# Patient Record
Sex: Female | Born: 1990
Health system: Southern US, Community
[De-identification: ages and names within clinical notes are randomized; demographics above are authoritative.]

## PROBLEM LIST (undated history)

## (undated) DIAGNOSIS — G4733 Obstructive sleep apnea (adult) (pediatric): Secondary | ICD-10-CM

## (undated) DIAGNOSIS — F419 Anxiety disorder, unspecified: Secondary | ICD-10-CM

## (undated) DIAGNOSIS — J45909 Unspecified asthma, uncomplicated: Secondary | ICD-10-CM

## (undated) DIAGNOSIS — F32A Depression, unspecified: Secondary | ICD-10-CM

## (undated) DIAGNOSIS — T7840XA Allergy, unspecified, initial encounter: Secondary | ICD-10-CM

## (undated) HISTORY — PX: MOUTH SURGERY: SHX715

## (undated) HISTORY — DX: Unspecified asthma, uncomplicated: J45.909

---

## 2017-02-21 DIAGNOSIS — Z719 Counseling, unspecified: Secondary | ICD-10-CM | POA: Diagnosis not present

## 2017-02-24 DIAGNOSIS — J301 Allergic rhinitis due to pollen: Secondary | ICD-10-CM | POA: Diagnosis not present

## 2017-02-24 DIAGNOSIS — J4520 Mild intermittent asthma, uncomplicated: Secondary | ICD-10-CM | POA: Diagnosis not present

## 2017-03-03 DIAGNOSIS — Z719 Counseling, unspecified: Secondary | ICD-10-CM | POA: Diagnosis not present

## 2017-03-07 DIAGNOSIS — Z719 Counseling, unspecified: Secondary | ICD-10-CM | POA: Diagnosis not present

## 2017-03-14 DIAGNOSIS — Z719 Counseling, unspecified: Secondary | ICD-10-CM | POA: Diagnosis not present

## 2017-03-21 DIAGNOSIS — Z719 Counseling, unspecified: Secondary | ICD-10-CM | POA: Diagnosis not present

## 2017-04-12 DIAGNOSIS — J4520 Mild intermittent asthma, uncomplicated: Secondary | ICD-10-CM | POA: Diagnosis not present

## 2017-04-29 ENCOUNTER — Other Ambulatory Visit (HOSPITAL_COMMUNITY)
Admission: RE | Admit: 2017-04-29 | Discharge: 2017-04-29 | Disposition: A | Discharge status: Home / Self Care | Payer: 59 | Source: Ambulatory Visit | Attending: Obstetrics and Gynecology | Admitting: Obstetrics and Gynecology

## 2017-04-29 ENCOUNTER — Other Ambulatory Visit: Payer: Self-pay | Admitting: Obstetrics and Gynecology

## 2017-04-29 DIAGNOSIS — R87610 Atypical squamous cells of undetermined significance on cytologic smear of cervix (ASC-US): Secondary | ICD-10-CM | POA: Diagnosis not present

## 2017-04-29 DIAGNOSIS — Z124 Encounter for screening for malignant neoplasm of cervix: Secondary | ICD-10-CM | POA: Diagnosis not present

## 2017-04-29 DIAGNOSIS — Z01419 Encounter for gynecological examination (general) (routine) without abnormal findings: Secondary | ICD-10-CM | POA: Diagnosis not present

## 2017-04-29 DIAGNOSIS — R87810 Cervical high risk human papillomavirus (HPV) DNA test positive: Secondary | ICD-10-CM | POA: Insufficient documentation

## 2017-05-04 LAB — CYTOLOGY - PAP
DIAGNOSIS: UNDETERMINED — AB
HPV (WINDOPATH): DETECTED — AB
HPV 16/18/45 genotyping: NEGATIVE

## 2017-05-06 DIAGNOSIS — Z01411 Encounter for gynecological examination (general) (routine) with abnormal findings: Secondary | ICD-10-CM | POA: Diagnosis not present

## 2017-06-16 DIAGNOSIS — J4521 Mild intermittent asthma with (acute) exacerbation: Secondary | ICD-10-CM | POA: Diagnosis not present

## 2017-06-29 DIAGNOSIS — Z23 Encounter for immunization: Secondary | ICD-10-CM | POA: Diagnosis not present

## 2017-09-05 DIAGNOSIS — Z0000 Encounter for general adult medical examination without abnormal findings: Secondary | ICD-10-CM | POA: Diagnosis not present

## 2017-09-05 DIAGNOSIS — J4520 Mild intermittent asthma, uncomplicated: Secondary | ICD-10-CM | POA: Diagnosis not present

## 2017-09-05 DIAGNOSIS — R61 Generalized hyperhidrosis: Secondary | ICD-10-CM | POA: Diagnosis not present

## 2017-09-05 DIAGNOSIS — Z23 Encounter for immunization: Secondary | ICD-10-CM | POA: Diagnosis not present

## 2018-03-08 DIAGNOSIS — J0100 Acute maxillary sinusitis, unspecified: Secondary | ICD-10-CM | POA: Diagnosis not present

## 2018-03-08 DIAGNOSIS — J4520 Mild intermittent asthma, uncomplicated: Secondary | ICD-10-CM | POA: Diagnosis not present

## 2018-05-02 DIAGNOSIS — J4520 Mild intermittent asthma, uncomplicated: Secondary | ICD-10-CM | POA: Diagnosis not present

## 2018-05-02 DIAGNOSIS — J301 Allergic rhinitis due to pollen: Secondary | ICD-10-CM | POA: Diagnosis not present

## 2018-06-01 DIAGNOSIS — M25531 Pain in right wrist: Secondary | ICD-10-CM | POA: Diagnosis not present

## 2018-06-06 DIAGNOSIS — J301 Allergic rhinitis due to pollen: Secondary | ICD-10-CM | POA: Diagnosis not present

## 2018-06-06 DIAGNOSIS — J3081 Allergic rhinitis due to animal (cat) (dog) hair and dander: Secondary | ICD-10-CM | POA: Diagnosis not present

## 2018-06-06 DIAGNOSIS — J4540 Moderate persistent asthma, uncomplicated: Secondary | ICD-10-CM | POA: Diagnosis not present

## 2018-06-06 DIAGNOSIS — J3089 Other allergic rhinitis: Secondary | ICD-10-CM | POA: Diagnosis not present

## 2018-06-12 DIAGNOSIS — Z01419 Encounter for gynecological examination (general) (routine) without abnormal findings: Secondary | ICD-10-CM | POA: Diagnosis not present

## 2018-06-24 DIAGNOSIS — J301 Allergic rhinitis due to pollen: Secondary | ICD-10-CM | POA: Diagnosis not present

## 2018-06-24 DIAGNOSIS — J3081 Allergic rhinitis due to animal (cat) (dog) hair and dander: Secondary | ICD-10-CM | POA: Diagnosis not present

## 2018-06-24 DIAGNOSIS — J3089 Other allergic rhinitis: Secondary | ICD-10-CM | POA: Diagnosis not present

## 2018-07-03 DIAGNOSIS — J3089 Other allergic rhinitis: Secondary | ICD-10-CM | POA: Diagnosis not present

## 2018-07-03 DIAGNOSIS — J3081 Allergic rhinitis due to animal (cat) (dog) hair and dander: Secondary | ICD-10-CM | POA: Diagnosis not present

## 2018-07-03 DIAGNOSIS — J301 Allergic rhinitis due to pollen: Secondary | ICD-10-CM | POA: Diagnosis not present

## 2018-07-19 DIAGNOSIS — J301 Allergic rhinitis due to pollen: Secondary | ICD-10-CM | POA: Diagnosis not present

## 2018-07-19 DIAGNOSIS — J3081 Allergic rhinitis due to animal (cat) (dog) hair and dander: Secondary | ICD-10-CM | POA: Diagnosis not present

## 2018-07-19 DIAGNOSIS — J3089 Other allergic rhinitis: Secondary | ICD-10-CM | POA: Diagnosis not present

## 2018-08-10 DIAGNOSIS — J3089 Other allergic rhinitis: Secondary | ICD-10-CM | POA: Diagnosis not present

## 2018-08-10 DIAGNOSIS — J3081 Allergic rhinitis due to animal (cat) (dog) hair and dander: Secondary | ICD-10-CM | POA: Diagnosis not present

## 2018-08-10 DIAGNOSIS — J301 Allergic rhinitis due to pollen: Secondary | ICD-10-CM | POA: Diagnosis not present

## 2018-08-14 DIAGNOSIS — J3089 Other allergic rhinitis: Secondary | ICD-10-CM | POA: Diagnosis not present

## 2018-08-14 DIAGNOSIS — J3081 Allergic rhinitis due to animal (cat) (dog) hair and dander: Secondary | ICD-10-CM | POA: Diagnosis not present

## 2018-08-14 DIAGNOSIS — J301 Allergic rhinitis due to pollen: Secondary | ICD-10-CM | POA: Diagnosis not present

## 2018-08-21 DIAGNOSIS — J301 Allergic rhinitis due to pollen: Secondary | ICD-10-CM | POA: Diagnosis not present

## 2018-08-21 DIAGNOSIS — J3081 Allergic rhinitis due to animal (cat) (dog) hair and dander: Secondary | ICD-10-CM | POA: Diagnosis not present

## 2018-08-21 DIAGNOSIS — J3089 Other allergic rhinitis: Secondary | ICD-10-CM | POA: Diagnosis not present

## 2018-08-23 DIAGNOSIS — J301 Allergic rhinitis due to pollen: Secondary | ICD-10-CM | POA: Diagnosis not present

## 2018-08-23 DIAGNOSIS — J3089 Other allergic rhinitis: Secondary | ICD-10-CM | POA: Diagnosis not present

## 2018-08-23 DIAGNOSIS — J3081 Allergic rhinitis due to animal (cat) (dog) hair and dander: Secondary | ICD-10-CM | POA: Diagnosis not present

## 2018-08-28 DIAGNOSIS — J3081 Allergic rhinitis due to animal (cat) (dog) hair and dander: Secondary | ICD-10-CM | POA: Diagnosis not present

## 2018-08-28 DIAGNOSIS — J3089 Other allergic rhinitis: Secondary | ICD-10-CM | POA: Diagnosis not present

## 2018-08-28 DIAGNOSIS — J301 Allergic rhinitis due to pollen: Secondary | ICD-10-CM | POA: Diagnosis not present

## 2018-09-06 DIAGNOSIS — J3081 Allergic rhinitis due to animal (cat) (dog) hair and dander: Secondary | ICD-10-CM | POA: Diagnosis not present

## 2018-09-06 DIAGNOSIS — J3089 Other allergic rhinitis: Secondary | ICD-10-CM | POA: Diagnosis not present

## 2018-09-06 DIAGNOSIS — J301 Allergic rhinitis due to pollen: Secondary | ICD-10-CM | POA: Diagnosis not present

## 2018-09-11 DIAGNOSIS — J3089 Other allergic rhinitis: Secondary | ICD-10-CM | POA: Diagnosis not present

## 2018-09-11 DIAGNOSIS — J301 Allergic rhinitis due to pollen: Secondary | ICD-10-CM | POA: Diagnosis not present

## 2018-09-11 DIAGNOSIS — J3081 Allergic rhinitis due to animal (cat) (dog) hair and dander: Secondary | ICD-10-CM | POA: Diagnosis not present

## 2018-09-12 DIAGNOSIS — E782 Mixed hyperlipidemia: Secondary | ICD-10-CM | POA: Diagnosis not present

## 2018-09-12 DIAGNOSIS — Z0000 Encounter for general adult medical examination without abnormal findings: Secondary | ICD-10-CM | POA: Diagnosis not present

## 2018-09-20 DIAGNOSIS — J301 Allergic rhinitis due to pollen: Secondary | ICD-10-CM | POA: Diagnosis not present

## 2018-09-20 DIAGNOSIS — J3081 Allergic rhinitis due to animal (cat) (dog) hair and dander: Secondary | ICD-10-CM | POA: Diagnosis not present

## 2018-09-20 DIAGNOSIS — J3089 Other allergic rhinitis: Secondary | ICD-10-CM | POA: Diagnosis not present

## 2018-09-25 DIAGNOSIS — J3089 Other allergic rhinitis: Secondary | ICD-10-CM | POA: Diagnosis not present

## 2018-09-25 DIAGNOSIS — J301 Allergic rhinitis due to pollen: Secondary | ICD-10-CM | POA: Diagnosis not present

## 2018-09-25 DIAGNOSIS — J3081 Allergic rhinitis due to animal (cat) (dog) hair and dander: Secondary | ICD-10-CM | POA: Diagnosis not present

## 2019-10-12 ENCOUNTER — Other Ambulatory Visit: Payer: Self-pay | Admitting: Obstetrics and Gynecology

## 2019-10-12 DIAGNOSIS — N63 Unspecified lump in unspecified breast: Secondary | ICD-10-CM

## 2019-10-16 ENCOUNTER — Other Ambulatory Visit: Payer: Self-pay | Admitting: Obstetrics and Gynecology

## 2019-10-19 ENCOUNTER — Ambulatory Visit: Payer: 59 | Admitting: Registered"

## 2019-10-19 ENCOUNTER — Ambulatory Visit: Payer: Self-pay | Admitting: Registered"

## 2019-11-12 ENCOUNTER — Other Ambulatory Visit: Payer: 59

## 2019-11-14 ENCOUNTER — Ambulatory Visit
Admission: RE | Admit: 2019-11-14 | Discharge: 2019-11-14 | Disposition: A | Discharge status: Home / Self Care | Payer: 59 | Source: Ambulatory Visit | Attending: Obstetrics and Gynecology | Admitting: Obstetrics and Gynecology

## 2019-11-14 ENCOUNTER — Other Ambulatory Visit: Payer: Self-pay

## 2019-11-14 DIAGNOSIS — N63 Unspecified lump in unspecified breast: Secondary | ICD-10-CM

## 2021-06-26 IMAGING — US US BREAST*R* LIMITED INC AXILLA
1 series · 9 of 9 positions shown · non-contrast
Comparison: Prior reports from [REDACTED] dated 07/18/2012
and 10/02/2013.
COMPARISON: Prior reports from [REDACTED] dated 07/18/2012
and 10/02/2013.

Addendum:
CLINICAL DATA: 28-year-old female with a palpable abnormality in
the 9 o'clock region of the right breast which she states has been
clinically stable since 3156. Patient had the mass evaluated on
07/18/2012 and 10/02/2013 at [REDACTED]. Two masses were
visualized and thought to be benign fibroadenomata.

EXAM:
ULTRASOUND OF THE RIGHT BREAST

[Series 1: us breast*right* limited inc axilla · 0.07mm/px · 9 of 9 slices shown]
[im 1/9]
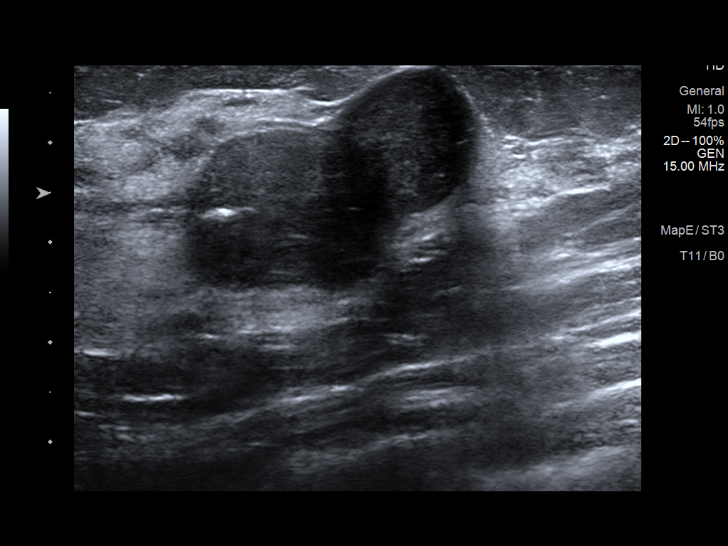
[im 2/9]
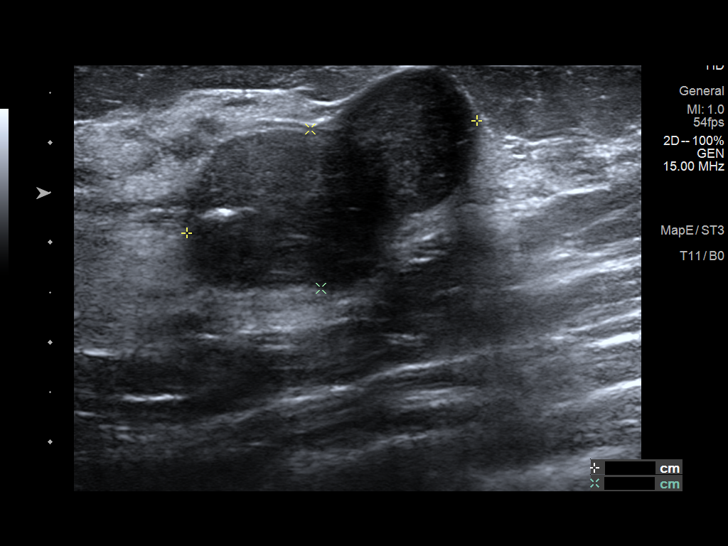
[im 3/9]
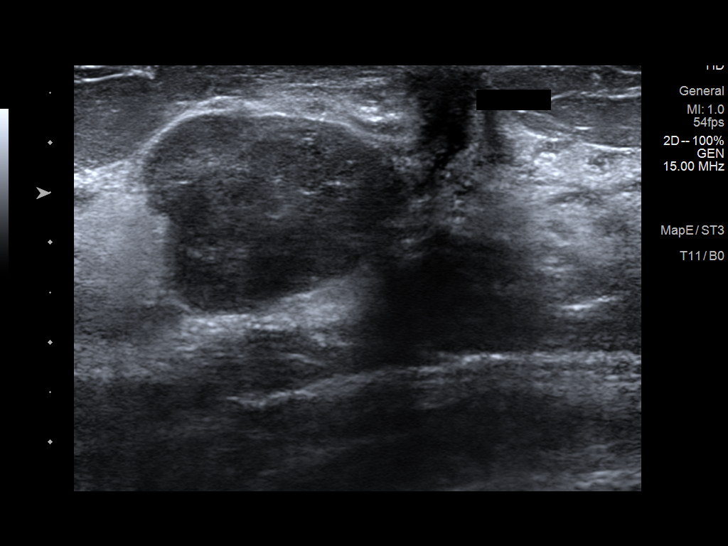
[im 4/9]
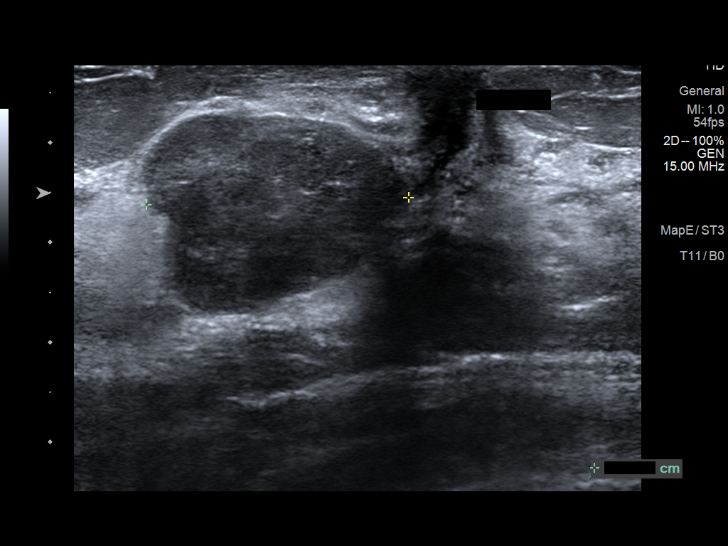
[im 5/9]
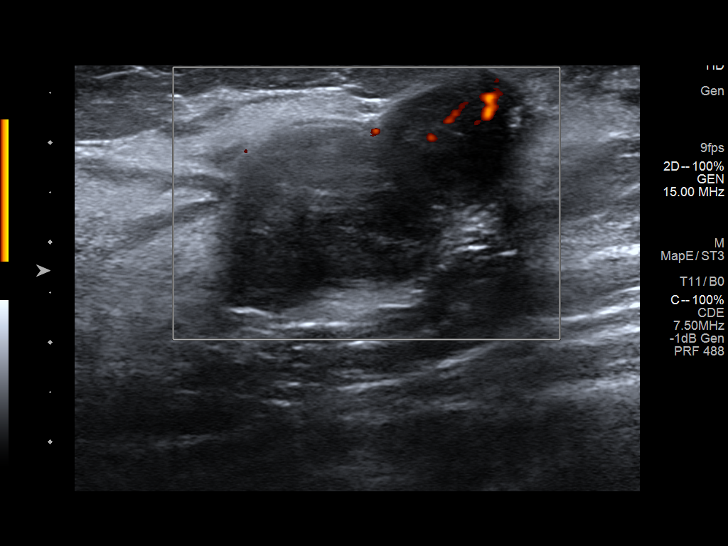
[im 6/9]
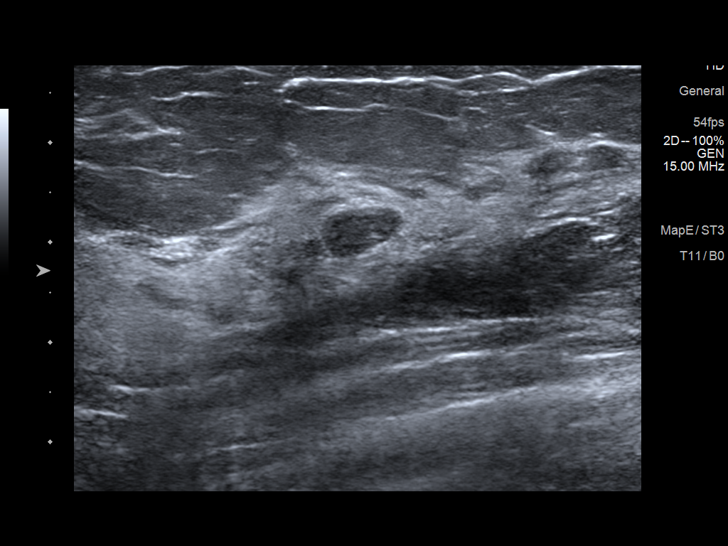
[im 7/9]
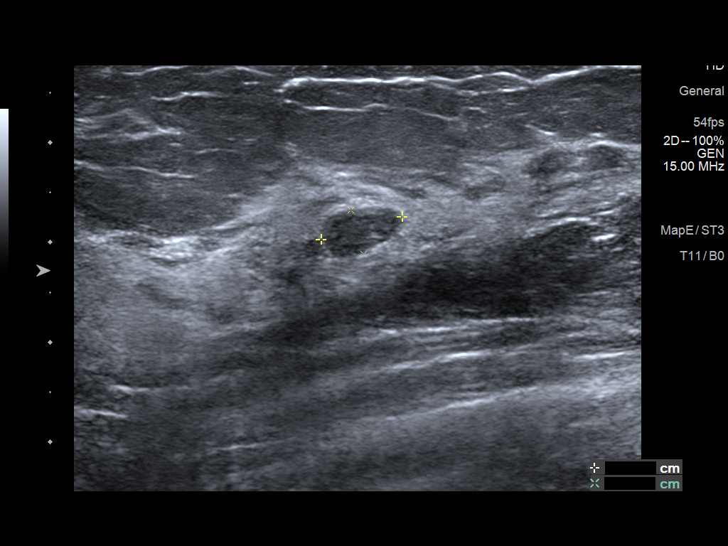
[im 8/9]
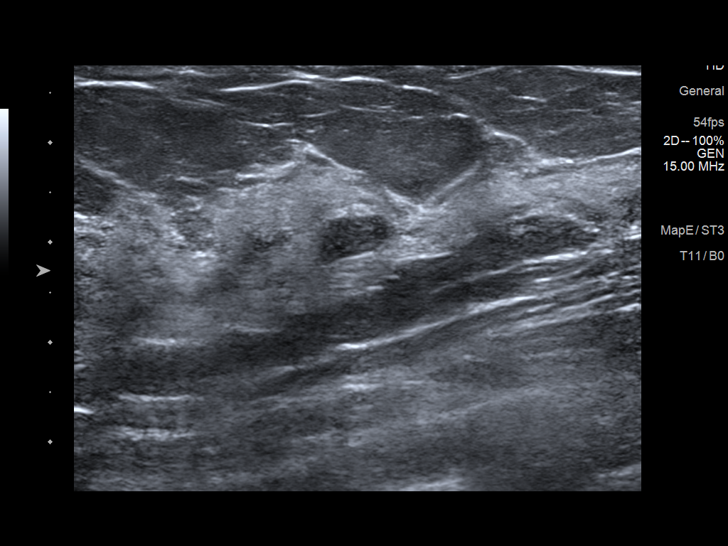
[im 9/9]
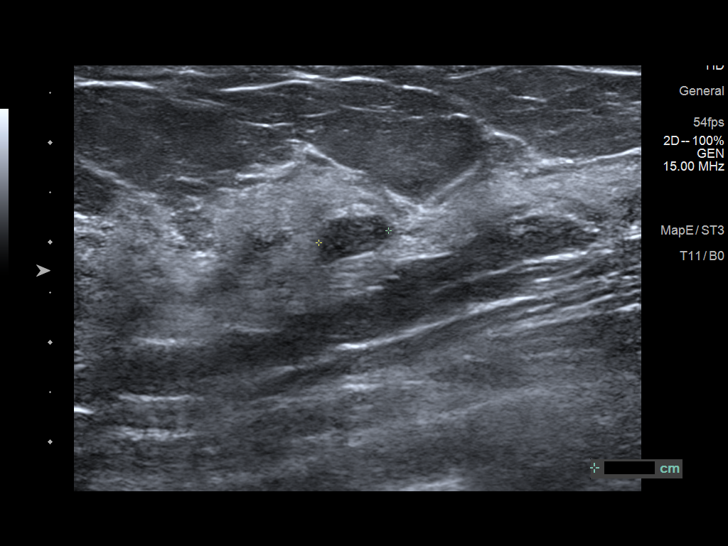

[9 of 9 positions shown; findings below may reference images not displayed]

FINDINGS: On physical exam, I palpate a discrete mass in the right breast at 9
o'clock in the subareolar location.

Targeted ultrasound is performed, showing a well-circumscribed
hypoechoic mass in the right breast at 9 o'clock in the subareolar
location measuring 3.1 x 2.6 x 1.6 cm. There is a well-circumscribed
hypoechoic mass in the right breast at 9 o'clock 2 cm from the
nipple measuring 5 x 8 x 7 mm. On the prior ultrasound dated
07/18/2012 they measured 4 x 4 cm and 9.2 x 9.0 mm. On the
ultrasound dated 08/02/2013 they measured 3.7 x 3.4 x 2.0 cm and 4 x
9 x 9 mm.
IMPRESSION: Stable masses in the right breast compatible with benign
fibroadenomata.

RECOMMENDATION:
If the clinical exam remains benign/stable screening mammography can
be deferred until the age of 40.

I have discussed the findings and recommendations with the patient.
If applicable, a reminder letter will be sent to the patient
regarding the next appointment.

BI-RADS CATEGORY  2: Benign.

ADDENDUM:
Prior images from [facility]OS[facility]] dated 07/18/2012 and 10/02/2013
have become available for comparison. On the prior mammogram dated
07/18/2012 the subareolar mass measured 3.8 x 2.3 x 2.9 cm. The
subareolar mass on the 10/02/2013 study measured 3.4 x 2.0 x 3.7 cm.
The smaller mass at 9 o'clock 5 cm from the nipple measured 9 x 4 x
9 mm.

The mass in the subareolar location of the right breast is slightly
smaller compared to the prior exams and the mass at 9 o'clock 5 cm
from the nipple is stable. Stability implies the masses are benign
fibroadenomata. If the clinical exam remains benign/stable screening
mammography can be deferred until the age of 40.

*** End of Addendum ***
FINDINGS: On physical exam, I palpate a discrete mass in the right breast at 9
o'clock in the subareolar location.

Targeted ultrasound is performed, showing a well-circumscribed
hypoechoic mass in the right breast at 9 o'clock in the subareolar
location measuring 3.1 x 2.6 x 1.6 cm. There is a well-circumscribed
hypoechoic mass in the right breast at 9 o'clock 2 cm from the
nipple measuring 5 x 8 x 7 mm. On the prior ultrasound dated
07/18/2012 they measured 4 x 4 cm and 9.2 x 9.0 mm. On the
ultrasound dated 08/02/2013 they measured 3.7 x 3.4 x 2.0 cm and 4 x
9 x 9 mm.
IMPRESSION: Stable masses in the right breast compatible with benign
fibroadenomata.

RECOMMENDATION:
If the clinical exam remains benign/stable screening mammography can
be deferred until the age of 40.

I have discussed the findings and recommendations with the patient.
If applicable, a reminder letter will be sent to the patient
regarding the next appointment.

BI-RADS CATEGORY  2: Benign.

## 2023-02-26 NOTE — H&P (Signed)
Belinda Castro is an 32 y.o. G0 who is admitted for Laparoscopic bilateral salpingectomy due to desire for permanent sterilization.  She states that she does not desire to give birth to children and would like permanent sterilization.She has known for a long time that she does not desire children and is 100% positive about this.She has irregular cycles and reports that she constantly takes pregnancy tests and has anxiety when sexually active due to this. Pt does not desire longer term birth control options. Vasectomy is not an option.  Patient Active Problem List   Diagnosis Date Noted   Encounter for sterilization 02/27/2023   Anxiety and depression 02/27/2023   OSA (obstructive sleep apnea) 02/27/2023   Allergies 02/27/2023    MEDICAL/FAMILY/SOCIAL HX: No LMP recorded.    Past Medical History:  Diagnosis Date   Allergies    Anxiety and depression    OSA (obstructive sleep apnea)     Past Surgical History:  Procedure Laterality Date   MOUTH SURGERY      No family history on file.  Social History:  has no history on file for tobacco use, alcohol use, and drug use.  ALLERGIES/MEDS:  Allergies: Not on File  No medications prior to admission.     Review of Systems  Constitutional: Negative.   HENT: Negative.    Eyes: Negative.   Respiratory: Negative.    Cardiovascular: Negative.   Gastrointestinal: Negative.   Genitourinary: Negative.   Musculoskeletal: Negative.   Skin: Negative.   Neurological: Negative.   Endo/Heme/Allergies: Negative.   Psychiatric/Behavioral: Negative.      There were no vitals taken for this visit. Gen:  NAD, pleasant and cooperative Cardio:  RRR Pulm:  CTAB, no wheezes/rales/rhonchi Abd:  Soft, non-distended, non-tender throughout, no rebound/guarding Ext:  No bilateral LE edema, no bilateral calf tenderness  No results found for this or any previous visit (from the past 24 hour(s)).  No results found.   ASSESSMENT/PLAN: Belinda Castro is a 32 y.o. G0 who is admitted for Laparoscopic bilateral salpingectomy due to desire for permanent sterilization.  - Admit to [facility] Main OR - Admit labs (CBC, T&S, urine HCG) - Diet:  Per anesthesia/ERAS pathway - IVF:  Per anesthesia - VTE Prophylaxis:  SCDs - Antibiotics: None - D/C home same-day  Consents: I have discussed with the patient that this surgery is performed to provide permanent female sterilization by removing the entirety of each fallopian tube.  Prior to surgery, the risks and benefits of the surgery, as well as alternative treatments, have been discussed.  The risks of proceeding with this surgery include, but are not limited to, bleeding, including the need for a blood transfusion, infection, damage to surrounding organs and tissues, damage to bladder, damage to ureters, causing kidney damage, and requiring additional procedures, damage to bowels, resulting in further surgery, postoperative pain, short-term and long-term, scarring on the abdominal wall and intra-abdominally, need for further surgery, development of an incisional hernia, need for an open procedure, deep vein thrombosis and/or pulmonary embolism, wound infection and/or separation, painful intercourse, failure of the procedure to prevent pregnancy, increased risk of ectopic pregnancy requiring surgery if procedure failure occurs complications the course of which cannot be predicted or prevented, and death. Patient understands that her periods may become heavier after having had a tubal sterilization procedure. We discussed possibility of needing hormonal contraceptives to aid with the heaviness of bleeding. Patient was consented for blood products.  The patient is aware that bleeding may result in the  need for a blood transfusion which includes risk of transmission of HIV (1:2 million), Hepatitis C (1:2 million), and Hepatitis B (1:200 thousand) and transfusion reaction.  Patient voiced understanding of the above  risks as well as understanding of indications for blood transfusion.  Steva Ready, DO

## 2023-02-27 ENCOUNTER — Encounter: Payer: Self-pay | Admitting: Obstetrics and Gynecology

## 2023-02-27 DIAGNOSIS — Z302 Encounter for sterilization: Secondary | ICD-10-CM

## 2023-02-27 DIAGNOSIS — G4733 Obstructive sleep apnea (adult) (pediatric): Secondary | ICD-10-CM | POA: Insufficient documentation

## 2023-02-27 DIAGNOSIS — F419 Anxiety disorder, unspecified: Secondary | ICD-10-CM | POA: Insufficient documentation

## 2023-02-27 DIAGNOSIS — F32A Depression, unspecified: Secondary | ICD-10-CM | POA: Insufficient documentation

## 2023-02-27 DIAGNOSIS — T7840XA Allergy, unspecified, initial encounter: Secondary | ICD-10-CM | POA: Insufficient documentation

## 2023-03-22 NOTE — Pre-Procedure Instructions (Signed)
Surgical Instructions    Your procedure is scheduled on 03-30-23.  Report to [facility] Main Entrance "A" at 5:30 A.M., then check in with the Admitting office.  Call this number if you have problems the morning of surgery:  [phone]  If you have any questions prior to your surgery date call [phone]: Open Monday-Friday 8am-4pm If you experience any cold or flu symptoms such as cough, fever, chills, shortness of breath, etc. between now and your scheduled surgery, please notify us at the above number.     Remember:  Do not eat after midnight the night before your surgery  You may drink clear liquids until 4:30am  the morning of your surgery.   Clear liquids allowed are: Water, Non-Citrus Juices (without pulp), Carbonated Beverages, Clear Tea, Black Coffee Only (NO MILK, CREAM OR POWDERED CREAMER of any kind), and Gatorade.     Take these medicines the morning of surgery with A SIP OF WATER   acetaminophen (TYLENOL)   albuterol (VENTOLIN HFA)   buPROPion (WELLBUTRIN XL   escitalopram (LEXAPRO)   famotidine (PEPCID)   fexofenadine (ALLEGRA   propranolol (INDERAL)   SUMAtriptan (IMITREX)   TRELEGY ELLIPTA    As of today, STOP taking any Aspirin (unless otherwise instructed by your surgeon) Aleve, Naproxen, Ibuprofen, Motrin, Advil, Goody's, BC's, all herbal medications, fish oil, and all vitamins. This includes your medications : aspirin-acetaminophen-caffeine (EXCEDRIN MIGRAINE                      Do NOT Smoke (Tobacco/Vaping) for 24 hours prior to your procedure.  If you use a CPAP at night, you may bring your mask/headgear for your overnight stay.   Contacts, glasses, piercing's, hearing aid's, dentures or partials may not be worn into surgery, please bring cases for these belongings.    For patients admitted to the hospital, discharge time will be determined by your treatment team.   Patients discharged the day of surgery will not be allowed to drive home, and  someone needs to stay with them for 24 hours.  SURGICAL WAITING ROOM VISITATION Patients having surgery or a procedure may have no more than 2 support people in the waiting area - these visitors may rotate.   Children under the age of 21 must have an adult with them who is not the patient. If the patient needs to stay at the hospital during part of their recovery, the visitor guidelines for inpatient rooms apply. Pre-op nurse will coordinate an appropriate time for 1 support person to accompany patient in pre-op.  This support person may not rotate.   Please refer to the [facility] website for the visitor guidelines for Inpatients (after your surgery is over and you are in a regular room).    Special instructions:   [place]- Preparing For Surgery  Before surgery, you can play an important role. Because skin is not sterile, your skin needs to be as free of germs as possible. You can reduce the number of germs on your skin by washing with CHG (chlorahexidine gluconate) Soap before surgery.  CHG is an antiseptic cleaner which kills germs and bonds with the skin to continue killing germs even after washing.    Oral Hygiene is also important to reduce your risk of infection.  Remember - BRUSH YOUR TEETH THE MORNING OF SURGERY WITH YOUR REGULAR TOOTHPASTE  Please do not use if you have an allergy to CHG or antibacterial soaps. If your skin becomes reddened/irritated stop using  the CHG.  Do not shave (including legs and underarms) for at least 48 hours prior to first CHG shower. It is OK to shave your face.  Please follow these instructions carefully.   Shower the NIGHT BEFORE SURGERY and the MORNING OF SURGERY  If you chose to wash your hair, wash your hair first as usual with your normal shampoo.  After you shampoo, rinse your hair and body thoroughly to remove the shampoo.  Use CHG Soap as you would any other liquid soap. You can apply CHG directly to the skin and wash gently with a  scrungie or a clean washcloth.   Apply the CHG Soap to your body ONLY FROM THE NECK DOWN.  Do not use on open wounds or open sores. Avoid contact with your eyes, ears, mouth and genitals (private parts). Wash Face and genitals (private parts)  with your normal soap.   Wash thoroughly, paying special attention to the area where your surgery will be performed.  Thoroughly rinse your body with warm water from the neck down.  DO NOT shower/wash with your normal soap after using and rinsing off the CHG Soap.  Pat yourself dry with a CLEAN TOWEL.  Wear CLEAN PAJAMAS to bed the night before surgery  Place CLEAN SHEETS on your bed the night before your surgery  DO NOT SLEEP WITH PETS.   Day of Surgery: Take a shower with CHG soap. Do not wear jewelry or makeup Do not wear lotions, powders, perfumes/colognes, or deodorant. Do not shave 48 hours prior to surgery.  Men may shave face and neck. Do not bring valuables to the hospital.  [facility] is not responsible for any belongings or valuables. Do not wear nail polish, gel polish, artificial nails, or any other type of covering on natural nails (fingers and toes) If you have artificial nails or gel coating that need to be removed by a nail salon, please have this removed prior to surgery. Artificial nails or gel coating may interfere with anesthesia's ability to adequately monitor your vital signs.  Wear Clean/Comfortable clothing the morning of surgery Remember to brush your teeth WITH YOUR REGULAR TOOTHPASTE.   Please read over the following fact sheets that you were given.    If you received a COVID test during your pre-op visit  it is requested that you wear a mask when out in public, stay away from anyone that may not be feeling well and notify your surgeon if you develop symptoms. If you have been in contact with anyone that has tested positive in the last 10 days please notify you surgeon.

## 2023-03-23 ENCOUNTER — Other Ambulatory Visit: Payer: Self-pay

## 2023-03-23 ENCOUNTER — Encounter (HOSPITAL_COMMUNITY): Payer: Self-pay

## 2023-03-23 ENCOUNTER — Encounter (HOSPITAL_COMMUNITY)
Admission: RE | Admit: 2023-03-23 | Discharge: 2023-03-23 | Disposition: A | Discharge status: Home / Self Care | Payer: 59 | Source: Ambulatory Visit | Attending: Obstetrics and Gynecology | Admitting: Obstetrics and Gynecology

## 2023-03-23 DIAGNOSIS — Z01818 Encounter for other preprocedural examination: Secondary | ICD-10-CM | POA: Insufficient documentation

## 2023-03-23 DIAGNOSIS — Z302 Encounter for sterilization: Secondary | ICD-10-CM

## 2023-03-23 LAB — CBC
HCT: 42.2 % (ref 36.0–46.0)
Hemoglobin: 13.6 g/dL (ref 12.0–15.0)
MCH: 27 pg (ref 26.0–34.0)
MCHC: 32.2 g/dL (ref 30.0–36.0)
MCV: 83.9 fL (ref 80.0–100.0)
Platelets: 352 10*3/uL (ref 150–400)
RBC: 5.03 MIL/uL (ref 3.87–5.11)
RDW: 14.1 % (ref 11.5–15.5)
WBC: 6.4 10*3/uL (ref 4.0–10.5)
nRBC: 0 % (ref 0.0–0.2)

## 2023-03-23 LAB — TYPE AND SCREEN
ABO/RH(D): O POS
Antibody Screen: NEGATIVE

## 2023-03-23 NOTE — Progress Notes (Signed)
PCP - Peri Maris, FNP Cardiologist - denies  PPM/ICD - denies   Chest x-ray - denies EKG - denies Stress Test - denies ECHO - denies Cardiac Cath - denies  Sleep Study - OSA+ CPAP - nightly  DM- denies  ASA/Blood Thinner Instructions: n/a   ERAS Protcol - yes, no drink   COVID TEST- n/a   Anesthesia review: no  Patient denies shortness of breath, fever, cough and chest pain at PAT appointment   All instructions explained to the patient, with a verbal understanding of the material. Patient agrees to go over the instructions while at home for a better understanding.  The opportunity to ask questions was provided.

## 2023-03-30 ENCOUNTER — Encounter (HOSPITAL_COMMUNITY)
Admission: RE | Disposition: A | Discharge status: Home / Self Care | Payer: Self-pay | Source: Ambulatory Visit | Attending: Obstetrics and Gynecology

## 2023-03-30 ENCOUNTER — Ambulatory Visit (HOSPITAL_BASED_OUTPATIENT_CLINIC_OR_DEPARTMENT_OTHER): Payer: 59 | Admitting: Anesthesiology

## 2023-03-30 ENCOUNTER — Encounter (HOSPITAL_COMMUNITY): Payer: Self-pay | Admitting: Obstetrics and Gynecology

## 2023-03-30 ENCOUNTER — Ambulatory Visit (HOSPITAL_COMMUNITY): Payer: 59 | Admitting: Anesthesiology

## 2023-03-30 ENCOUNTER — Ambulatory Visit (HOSPITAL_COMMUNITY)
Admission: RE | Admit: 2023-03-30 | Discharge: 2023-03-30 | Disposition: A | Discharge status: Home / Self Care | Payer: 59 | Source: Ambulatory Visit | Attending: Obstetrics and Gynecology | Admitting: Obstetrics and Gynecology

## 2023-03-30 ENCOUNTER — Other Ambulatory Visit: Payer: Self-pay

## 2023-03-30 DIAGNOSIS — F32A Depression, unspecified: Secondary | ICD-10-CM | POA: Diagnosis not present

## 2023-03-30 DIAGNOSIS — T7840XA Allergy, unspecified, initial encounter: Secondary | ICD-10-CM | POA: Insufficient documentation

## 2023-03-30 DIAGNOSIS — J45909 Unspecified asthma, uncomplicated: Secondary | ICD-10-CM | POA: Insufficient documentation

## 2023-03-30 DIAGNOSIS — Z01818 Encounter for other preprocedural examination: Secondary | ICD-10-CM

## 2023-03-30 DIAGNOSIS — G4730 Sleep apnea, unspecified: Secondary | ICD-10-CM | POA: Diagnosis not present

## 2023-03-30 DIAGNOSIS — F419 Anxiety disorder, unspecified: Secondary | ICD-10-CM | POA: Insufficient documentation

## 2023-03-30 DIAGNOSIS — Z302 Encounter for sterilization: Secondary | ICD-10-CM

## 2023-03-30 DIAGNOSIS — G4733 Obstructive sleep apnea (adult) (pediatric): Secondary | ICD-10-CM | POA: Diagnosis not present

## 2023-03-30 HISTORY — DX: Allergy, unspecified, initial encounter: T78.40XA

## 2023-03-30 HISTORY — DX: Depression, unspecified: F32.A

## 2023-03-30 HISTORY — DX: Anxiety disorder, unspecified: F41.9

## 2023-03-30 HISTORY — DX: Obstructive sleep apnea (adult) (pediatric): G47.33

## 2023-03-30 HISTORY — PX: LAPAROSCOPIC BILATERAL SALPINGECTOMY: SHX5889

## 2023-03-30 LAB — POCT PREGNANCY, URINE: Preg Test, Ur: NEGATIVE

## 2023-03-30 LAB — ABO/RH: ABO/RH(D): O POS

## 2023-03-30 SURGERY — SALPINGECTOMY, BILATERAL, LAPAROSCOPIC
Anesthesia: General | Laterality: Bilateral

## 2023-03-30 MED ORDER — BUPIVACAINE HCL (PF) 0.25 % IJ SOLN
INTRAMUSCULAR | Status: AC
  Filled 2023-03-30: qty 30

## 2023-03-30 MED ORDER — DEXAMETHASONE SODIUM PHOSPHATE 10 MG/ML IJ SOLN
INTRAMUSCULAR | Status: AC
  Filled 2023-03-30: qty 1

## 2023-03-30 MED ORDER — OXYCODONE HCL 5 MG PO TABS: 5.0000 mg | ORAL_TABLET | Freq: Four times a day (QID) | ORAL | 0 refills | Status: AC | PRN

## 2023-03-30 MED ORDER — MIDAZOLAM HCL 2 MG/2ML IJ SOLN
INTRAMUSCULAR | Status: DC | PRN
  Administered 2023-03-30: 2 mg via INTRAVENOUS

## 2023-03-30 MED ORDER — ONDANSETRON HCL 4 MG/2ML IJ SOLN
INTRAMUSCULAR | Status: AC
  Filled 2023-03-30: qty 2

## 2023-03-30 MED ORDER — SUGAMMADEX SODIUM 200 MG/2ML IV SOLN
INTRAVENOUS | Status: DC | PRN
  Administered 2023-03-30: 200 mg via INTRAVENOUS

## 2023-03-30 MED ORDER — BUPIVACAINE HCL (PF) 0.25 % IJ SOLN
INTRAMUSCULAR | Status: DC | PRN
  Administered 2023-03-30: 15 mL

## 2023-03-30 MED ORDER — PROPOFOL 10 MG/ML IV BOLUS
INTRAVENOUS | Status: AC
  Filled 2023-03-30: qty 20

## 2023-03-30 MED ORDER — ONDANSETRON HCL 4 MG/2ML IJ SOLN
INTRAMUSCULAR | Status: DC | PRN
  Administered 2023-03-30: 4 mg via INTRAVENOUS

## 2023-03-30 MED ORDER — LACTATED RINGERS IV SOLN: INTRAVENOUS | Status: DC

## 2023-03-30 MED ORDER — IBUPROFEN 800 MG PO TABS
800.0000 mg | ORAL_TABLET | Freq: Three times a day (TID) | ORAL | 0 refills | Status: AC | PRN
Start: 2023-03-30 — End: ?

## 2023-03-30 MED ORDER — ROCURONIUM BROMIDE 10 MG/ML (PF) SYRINGE
PREFILLED_SYRINGE | INTRAVENOUS | Status: DC | PRN
  Administered 2023-03-30: 60 mg via INTRAVENOUS

## 2023-03-30 MED ORDER — PROPOFOL 10 MG/ML IV BOLUS
INTRAVENOUS | Status: DC | PRN
  Administered 2023-03-30: 200 mg via INTRAVENOUS

## 2023-03-30 MED ORDER — MIDAZOLAM HCL 2 MG/2ML IJ SOLN
INTRAMUSCULAR | Status: AC
  Filled 2023-03-30: qty 2

## 2023-03-30 MED ORDER — AMISULPRIDE (ANTIEMETIC) 5 MG/2ML IV SOLN: 10.0000 mg | Freq: Once | INTRAVENOUS | Status: DC | PRN

## 2023-03-30 MED ORDER — KETOROLAC TROMETHAMINE 30 MG/ML IJ SOLN
INTRAMUSCULAR | Status: DC | PRN
  Administered 2023-03-30: 30 mg via INTRAVENOUS

## 2023-03-30 MED ORDER — CHLORHEXIDINE GLUCONATE 0.12 % MT SOLN
15.0000 mL | Freq: Once | OROMUCOSAL | Status: AC
  Administered 2023-03-30: 15 mL via OROMUCOSAL
  Filled 2023-03-30: qty 15

## 2023-03-30 MED ORDER — LIDOCAINE 2% (20 MG/ML) 5 ML SYRINGE
INTRAMUSCULAR | Status: AC
  Filled 2023-03-30: qty 5

## 2023-03-30 MED ORDER — ACETAMINOPHEN 500 MG PO TABS
1000.0000 mg | ORAL_TABLET | Freq: Once | ORAL | Status: AC
  Administered 2023-03-30: 1000 mg via ORAL
  Filled 2023-03-30: qty 2

## 2023-03-30 MED ORDER — OXYCODONE HCL 5 MG/5ML PO SOLN
5.0000 mg | Freq: Once | ORAL | Status: AC
  Administered 2023-03-30: 5 mg via ORAL

## 2023-03-30 MED ORDER — DEXAMETHASONE SODIUM PHOSPHATE 10 MG/ML IJ SOLN
INTRAMUSCULAR | Status: DC | PRN
  Administered 2023-03-30: 10 mg via INTRAVENOUS

## 2023-03-30 MED ORDER — SILVER NITRATE-POT NITRATE 75-25 % EX MISC
CUTANEOUS | Status: DC | PRN
  Administered 2023-03-30: 2

## 2023-03-30 MED ORDER — OXYCODONE HCL 5 MG/5ML PO SOLN
ORAL | Status: AC
  Filled 2023-03-30: qty 5

## 2023-03-30 MED ORDER — ROCURONIUM BROMIDE 10 MG/ML (PF) SYRINGE
PREFILLED_SYRINGE | INTRAVENOUS | Status: AC
  Filled 2023-03-30: qty 10

## 2023-03-30 MED ORDER — FENTANYL CITRATE (PF) 250 MCG/5ML IJ SOLN
INTRAMUSCULAR | Status: DC | PRN
  Administered 2023-03-30: 25 ug via INTRAVENOUS
  Administered 2023-03-30: 100 ug via INTRAVENOUS

## 2023-03-30 MED ORDER — PROPOFOL 500 MG/50ML IV EMUL
INTRAVENOUS | Status: DC | PRN
  Administered 2023-03-30: 125 ug/kg/min via INTRAVENOUS

## 2023-03-30 MED ORDER — ORAL CARE MOUTH RINSE: 15.0000 mL | Freq: Once | OROMUCOSAL | Status: AC

## 2023-03-30 MED ORDER — LIDOCAINE 2% (20 MG/ML) 5 ML SYRINGE
INTRAMUSCULAR | Status: DC | PRN
  Administered 2023-03-30: 60 mg via INTRAVENOUS

## 2023-03-30 MED ORDER — 0.9 % SODIUM CHLORIDE (POUR BTL) OPTIME
TOPICAL | Status: DC | PRN
  Administered 2023-03-30: 1000 mL

## 2023-03-30 MED ORDER — FENTANYL CITRATE (PF) 250 MCG/5ML IJ SOLN
INTRAMUSCULAR | Status: AC
  Filled 2023-03-30: qty 5

## 2023-03-30 MED ORDER — FENTANYL CITRATE (PF) 100 MCG/2ML IJ SOLN: 25.0000 ug | INTRAMUSCULAR | Status: DC | PRN

## 2023-03-30 SURGICAL SUPPLY — 37 items
ADH SKN CLS APL DERMABOND .7 (GAUZE/BANDAGES/DRESSINGS) ×1
APL PRP STRL LF DISP 70% ISPRP (MISCELLANEOUS) ×1
APL SRG 38 LTWT LNG FL B (MISCELLANEOUS)
APPLICATOR ARISTA FLEXITIP XL (MISCELLANEOUS) IMPLANT
CABLE HIGH FREQUENCY MONO STRZ (ELECTRODE) IMPLANT
CHLORAPREP W/TINT 26 (MISCELLANEOUS) ×1 IMPLANT
DERMABOND ADVANCED .7 DNX12 (GAUZE/BANDAGES/DRESSINGS) ×1 IMPLANT
GAUZE 4X4 16PLY ~~LOC~~+RFID DBL (SPONGE) ×1 IMPLANT
GLOVE BIO SURGEON STRL SZ 6.5 (GLOVE) ×2 IMPLANT
GLOVE BIOGEL PI IND STRL 6.5 (GLOVE) ×1 IMPLANT
GLOVE BIOGEL PI IND STRL 7.0 (GLOVE) ×2 IMPLANT
GOWN STRL REUS W/ TWL LRG LVL3 (GOWN DISPOSABLE) ×2 IMPLANT
GOWN STRL REUS W/TWL LRG LVL3 (GOWN DISPOSABLE) ×2
HEMOSTAT ARISTA ABSORB 3G PWDR (HEMOSTASIS) IMPLANT
IRRIG SUCT STRYKERFLOW 2 WTIP (MISCELLANEOUS)
IRRIGATION SUCT STRKRFLW 2 WTP (MISCELLANEOUS) IMPLANT
KIT PINK PAD W/HEAD ARE REST (MISCELLANEOUS) ×1
KIT PINK PAD W/HEAD ARM REST (MISCELLANEOUS) ×1 IMPLANT
KIT TURNOVER KIT B (KITS) ×1 IMPLANT
LIGASURE VESSEL 5MM BLUNT TIP (ELECTROSURGICAL) IMPLANT
NS IRRIG 1000ML POUR BTL (IV SOLUTION) ×1 IMPLANT
PACK LAPAROSCOPY BASIN (CUSTOM PROCEDURE TRAY) ×1 IMPLANT
PROTECTOR NERVE ULNAR (MISCELLANEOUS) ×2 IMPLANT
SET TUBE SMOKE EVAC HIGH FLOW (TUBING) ×1 IMPLANT
SHEARS HARMONIC ACE PLUS 36CM (ENDOMECHANICALS) IMPLANT
SLEEVE ADV FIXATION 5X100MM (TROCAR) ×1 IMPLANT
SOL ELECTROSURG ANTI STICK (MISCELLANEOUS) ×1
SOLUTION ELECTROSURG ANTI STCK (MISCELLANEOUS) IMPLANT
SUT VICRYL 0 UR6 27IN ABS (SUTURE) ×1 IMPLANT
SUT VICRYL 4-0 PS2 18IN ABS (SUTURE) ×1 IMPLANT
SYS BAG RETRIEVAL 10MM (BASKET)
SYSTEM BAG RETRIEVAL 10MM (BASKET) IMPLANT
TOWEL GREEN STERILE FF (TOWEL DISPOSABLE) ×2 IMPLANT
TRAY FOLEY W/BAG SLVR 14FR (SET/KITS/TRAYS/PACK) ×1 IMPLANT
TROCAR ADV FIXATION 11X100MM (TROCAR) ×1 IMPLANT
TROCAR ADV FIXATION 5X100MM (TROCAR) ×1 IMPLANT
WARMER LAPAROSCOPE (MISCELLANEOUS) ×1 IMPLANT

## 2023-03-30 NOTE — Anesthesia Preprocedure Evaluation (Signed)
Anesthesia Evaluation  Patient identified by MRN, date of birth, ID band Patient awake    Reviewed: Allergy & Precautions, NPO status , Patient's Chart, lab work & pertinent test results  Airway Mallampati: II  TM Distance: >3 FB Neck ROM: Full    Dental  (+) Dental Advisory Given   Pulmonary asthma , sleep apnea    breath sounds clear to auscultation       Cardiovascular negative cardio ROS  Rhythm:Regular Rate:Normal     Neuro/Psych negative neurological ROS     GI/Hepatic negative GI ROS, Neg liver ROS,,,  Endo/Other  negative endocrine ROS    Renal/GU negative Renal ROS     Musculoskeletal   Abdominal   Peds  Hematology negative hematology ROS (+)   Anesthesia Other Findings   Reproductive/Obstetrics                             Anesthesia Physical Anesthesia Plan  ASA: 2  Anesthesia Plan: General   Post-op Pain Management: Tylenol PO (pre-op)* and Toradol IV (intra-op)*   Induction: Intravenous  PONV Risk Score and Plan: 3 and Dexamethasone, Ondansetron, Midazolam and Treatment may vary due to age or medical condition  Airway Management Planned: Oral ETT  Additional Equipment: None  Intra-op Plan:   Post-operative Plan: Extubation in OR  Informed Consent: I have reviewed the patients History and Physical, chart, labs and discussed the procedure including the risks, benefits and alternatives for the proposed anesthesia with the patient or authorized representative who has indicated his/her understanding and acceptance.     Dental advisory given  Plan Discussed with: CRNA  Anesthesia Plan Comments:        Anesthesia Quick Evaluation

## 2023-03-30 NOTE — Anesthesia Postprocedure Evaluation (Signed)
Anesthesia Post Note  Patient: Belinda Castro  Procedure(s) Performed: LAPAROSCOPIC BILATERAL SALPINGECTOMY (Bilateral)     Patient location during evaluation: PACU Anesthesia Type: General Level of consciousness: awake and alert Pain management: pain level controlled Vital Signs Assessment: post-procedure vital signs reviewed and stable Respiratory status: spontaneous breathing, nonlabored ventilation, respiratory function stable and patient connected to nasal cannula oxygen Cardiovascular status: blood pressure returned to baseline and stable Postop Assessment: no apparent nausea or vomiting Anesthetic complications: no  No notable events documented.  Last Vitals:  Vitals:   03/30/23 0845 03/30/23 0900  BP: 118/69 119/69  Pulse: 69 68  Resp: 13 13  Temp:  36.7 C  SpO2: 95% 98%    Last Pain:  Vitals:   03/30/23 0845  TempSrc:   PainSc: 3    Pain Goal:                   Kennieth Rad

## 2023-03-30 NOTE — Anesthesia Procedure Notes (Signed)
Procedure Name: Intubation Date/Time: 03/30/2023 7:33 AM  Performed by: Randon Goldsmith, CRNAPre-anesthesia Checklist: Patient identified, Emergency Drugs available, Suction available and Patient being monitored Patient Re-evaluated:Patient Re-evaluated prior to induction Oxygen Delivery Method: Circle system utilized Preoxygenation: Pre-oxygenation with 100% oxygen Induction Type: IV induction Ventilation: Mask ventilation without difficulty Laryngoscope Size: Mac and 3 Grade View: Grade I Tube type: Oral Tube size: 7.0 mm Number of attempts: 1 Airway Equipment and Method: Stylet Placement Confirmation: ETT inserted through vocal cords under direct vision, positive ETCO2 and breath sounds checked- equal and bilateral Secured at: 22 cm Tube secured with: Tape Dental Injury: Teeth and Oropharynx as per pre-operative assessment

## 2023-03-30 NOTE — Op Note (Signed)
Pre Op Dx:   Desires permanent sterilization  Post Op Dx:   Same as pre-operative diagnosis  Procedure:   Laparoscopic bilateral salpingectomy   Surgeon:  Dr. Steva Ready Assistants:  RNFA  Anesthesia:  General   EBL:  5cc  IVF:  900cc UOP:  50cc   Drains:  Foley catheter Specimen removed:  Bilateral fallopian tubes - sent to pathology Device(s) implanted: None Case Type:  Clean-contaminated Findings:  Normal-appearing cervix. Normal-appearing uterus, bilateral fallopian tubes, and ovaries. Normal-appearing liver contours and gallbladder. Complications: None Indications:  32 y.o. G0 who desires permanent sterilization.  Description of each procedure:  After informed consent the patient was taken to the operating room and placed in dorsal supine position where general endotracheal anesthesia was administered and found to be adequate.  She was placed in dorsal lithotomy position.  She was prepped and draped in the usual sterile fashion. A timeout was called and the procedure confirmed. A Hulka uterine manipulator with and a Foley catheter were placed.  The abdomen was entered through a 5mm umbilical incision under direct visualization and a pneumoperitoneum was established.  Right and left lower quadrant ports were placed under visualization.  The entry point was visualized from an inferior port and atraumatic entry confirmed. The left fallopian tube was removed from its attachment to the normal-appearing left ovary starting at the fimbria and divided along the mesosalpinx to the level of the uterus. The tube was desiccated thoroughly and divided from the uterus. The specimen was removed.  This process was repeated on the contralateral side although the tube was noted to be a little longer and separated distal to the level of the uterus. Hemostasis confirmed. Bilateral lower quadrant ports withdrawn. The pneumoperitoneum was reduced completely and the camera port withdrawn under  visualization. The skin was closed with 4-0 Vicryl in subcuticular fashion. The Hulka tenaculum was removed and the cervix made hemostatic with pressure and silver nitrate.  The patient was returned to dorsal supine position, awakened and extubated in the OR having appeared to tolerate the procedure well.  All sponge, needle, and instrument counts were correct x 2 at the end of the case.   Disposition:  PACU  Steva Ready, DO

## 2023-03-30 NOTE — Transfer of Care (Signed)
Immediate Anesthesia Transfer of Care Note  Patient: Belinda Castro  Procedure(s) Performed: LAPAROSCOPIC BILATERAL SALPINGECTOMY (Bilateral)  Patient Location: PACU  Anesthesia Type:General  Level of Consciousness: drowsy and patient cooperative  Airway & Oxygen Therapy: Patient Spontanous Breathing  Post-op Assessment: Report given to RN and Post -op Vital signs reviewed and stable  Post vital signs: Reviewed and stable  Last Vitals:  Vitals Value Taken Time  BP 128/68 03/30/23 0823  Temp    Pulse 72 03/30/23 0824  Resp 15 03/30/23 0824  SpO2 94 % 03/30/23 0824  Vitals shown include unvalidated device data.  Last Pain:  Vitals:   03/30/23 0600  TempSrc: Oral  PainSc: 0-No pain         Complications: No notable events documented.

## 2023-03-30 NOTE — Interval H&P Note (Signed)
History and Physical Interval Note:  03/30/2023 7:15 AM  Belinda Castro  has presented today for surgery, with the diagnosis of Sterilization.  The various methods of treatment have been discussed with the patient and family. After consideration of risks, benefits and other options for treatment, the patient has consented to  Procedure(s): LAPAROSCOPIC BILATERAL SALPINGECTOMY (Bilateral) as a surgical intervention.  The patient's history has been reviewed, patient examined, no change in status, stable for surgery.  I have reviewed the patient's chart and labs.  Questions were answered to the patient's satisfaction.     Steva Ready

## 2023-03-31 ENCOUNTER — Encounter (HOSPITAL_COMMUNITY): Payer: Self-pay | Admitting: Obstetrics and Gynecology

## 2023-03-31 LAB — SURGICAL PATHOLOGY
